# Patient Record
Sex: Female | Born: 1980 | Hispanic: No | Marital: Single | Smoking: Never smoker
Health system: Southern US, Community
[De-identification: ages and names within clinical notes are randomized; demographics above are authoritative.]

## PROBLEM LIST (undated history)

## (undated) DIAGNOSIS — E785 Hyperlipidemia, unspecified: Secondary | ICD-10-CM

## (undated) DIAGNOSIS — G809 Cerebral palsy, unspecified: Secondary | ICD-10-CM

## (undated) DIAGNOSIS — N809 Endometriosis, unspecified: Secondary | ICD-10-CM

## (undated) DIAGNOSIS — J302 Other seasonal allergic rhinitis: Secondary | ICD-10-CM

## (undated) DIAGNOSIS — I10 Essential (primary) hypertension: Secondary | ICD-10-CM

## (undated) DIAGNOSIS — M797 Fibromyalgia: Secondary | ICD-10-CM

## (undated) HISTORY — DX: Cerebral palsy, unspecified: G80.9

## (undated) HISTORY — DX: Essential (primary) hypertension: I10

## (undated) HISTORY — DX: Fibromyalgia: M79.7

## (undated) HISTORY — DX: Other seasonal allergic rhinitis: J30.2

## (undated) HISTORY — DX: Endometriosis, unspecified: N80.9

## (undated) HISTORY — DX: Hyperlipidemia, unspecified: E78.5

---

## 2005-04-02 ENCOUNTER — Ambulatory Visit (HOSPITAL_COMMUNITY): Admission: RE | Admit: 2005-04-02 | Discharge: 2005-04-02 | Payer: Self-pay | Admitting: Obstetrics & Gynecology

## 2005-10-14 ENCOUNTER — Encounter: Admission: RE | Admit: 2005-10-14 | Discharge: 2005-10-28 | Payer: Self-pay | Admitting: Obstetrics & Gynecology

## 2007-04-26 ENCOUNTER — Encounter: Admission: RE | Admit: 2007-04-26 | Discharge: 2007-05-27 | Payer: Self-pay | Admitting: Family Medicine

## 2012-06-24 ENCOUNTER — Ambulatory Visit (INDEPENDENT_AMBULATORY_CARE_PROVIDER_SITE_OTHER): Payer: Medicare Other | Admitting: Obstetrics & Gynecology

## 2012-06-24 ENCOUNTER — Encounter: Payer: Self-pay | Admitting: Obstetrics & Gynecology

## 2012-06-24 VITALS — BP 138/86 | HR 104 | Temp 97.4°F

## 2012-06-24 DIAGNOSIS — N949 Unspecified condition associated with female genital organs and menstrual cycle: Secondary | ICD-10-CM

## 2012-06-24 NOTE — Progress Notes (Signed)
Subjective:     Tiffany Pineda is a 32 y.o. female here for a routine exam.  Current complaints: none.  Personal health questionnaire reviewed: no.   The following portions of the patient's history were reviewed and updated as appropriate: allergies, current medications, past family history, past medical history, past social history, past surgical history and problem list.  Review of Systems Pertinent items are noted in HPI.    Objective:   No exam today  Assessment:    H/O chronic pelvic pain--symptoms remain controlled on continuous COCP  Plan:   Return in a few months

## 2012-06-25 ENCOUNTER — Encounter: Payer: Self-pay | Admitting: Obstetrics & Gynecology

## 2012-06-25 DIAGNOSIS — G8929 Other chronic pain: Secondary | ICD-10-CM | POA: Insufficient documentation

## 2012-07-02 ENCOUNTER — Telehealth: Payer: Self-pay | Admitting: *Deleted

## 2012-07-07 ENCOUNTER — Ambulatory Visit: Payer: Self-pay | Admitting: Obstetrics & Gynecology

## 2012-07-14 ENCOUNTER — Encounter: Payer: Self-pay | Admitting: *Deleted

## 2012-07-21 ENCOUNTER — Encounter: Payer: Self-pay | Admitting: Obstetrics & Gynecology

## 2012-07-21 ENCOUNTER — Encounter: Payer: Self-pay | Admitting: *Deleted

## 2012-07-23 NOTE — Telephone Encounter (Signed)
Entered in error

## 2012-08-09 ENCOUNTER — Encounter: Payer: Self-pay | Admitting: Obstetrics & Gynecology

## 2012-09-23 ENCOUNTER — Encounter: Payer: Self-pay | Admitting: Obstetrics & Gynecology

## 2012-09-23 ENCOUNTER — Ambulatory Visit (INDEPENDENT_AMBULATORY_CARE_PROVIDER_SITE_OTHER): Payer: Medicare Other | Admitting: Obstetrics & Gynecology

## 2012-09-23 VITALS — BP 133/76 | HR 94 | Temp 98.3°F

## 2012-09-23 DIAGNOSIS — N949 Unspecified condition associated with female genital organs and menstrual cycle: Secondary | ICD-10-CM

## 2012-09-23 NOTE — Progress Notes (Signed)
Subjective:     Tiffany Pineda is a 32 y.o. female here for a routine exam.  Current complaints: follow up. Pt states she is doing well on the Hines. Pt states she has no concerns at this time.  Personal health questionnaire reviewed: yes.   Gynecologic History No LMP recorded. Patient is not currently having periods (Reason: Oral contraceptives). Contraception: OCP (estrogen/progesterone) Last Pap: 2012. Results were: normal  Obstetric History OB History   Grav Para Term Preterm Abortions TAB SAB Ect Mult Living                   The following portions of the patient's history were reviewed and updated as appropriate: allergies, current medications, past family history, past medical history, past social history, past surgical history and problem list.  Review of Systems Pertinent items are noted in HPI.    Objective:  Exam today   Assessment:     History of endometriosis, symptoms controlled at present on the current see COCP  Plan:     Return in 6 months or when necessary

## 2013-03-24 ENCOUNTER — Ambulatory Visit (INDEPENDENT_AMBULATORY_CARE_PROVIDER_SITE_OTHER): Payer: Medicare Other | Admitting: Obstetrics & Gynecology

## 2013-03-24 ENCOUNTER — Encounter: Payer: Self-pay | Admitting: Obstetrics & Gynecology

## 2013-03-24 VITALS — BP 155/90 | HR 98 | Temp 97.0°F

## 2013-03-24 DIAGNOSIS — Z01419 Encounter for gynecological examination (general) (routine) without abnormal findings: Secondary | ICD-10-CM

## 2013-03-24 DIAGNOSIS — Z124 Encounter for screening for malignant neoplasm of cervix: Secondary | ICD-10-CM

## 2013-03-24 NOTE — Addendum Note (Signed)
Addended by: Henriette CombsHATTON, Jisela Merlino L on: 03/24/2013 11:41 AM   Modules accepted: Orders

## 2013-03-24 NOTE — Progress Notes (Signed)
Subjective:     Tiffany Pineda is a 33 y.o. female here for a routine exam.  Current complaints: Pt has no complaints today.  Personal health questionnaire reviewed: yes.   Gynecologic History No LMP recorded. Patient is not currently having periods (Reason: Oral contraceptives). Contraception: none Last Pap: 2012. Results were: normal Last mammogram: n/a  Obstetric History OB History  No data available     The following portions of the patient's history were reviewed and updated as appropriate: allergies, current medications, past family history, past medical history, past social history, past surgical history and problem list.  Review of Systems Pertinent items are noted in HPI.    Objective:    BP 155/90  Pulse 98  Temp(Src) 97 F (36.1 C)  General Appearance:    Alert, cooperative, no distress, appears stated age  Head:    Normocephalic, without obvious abnormality, atraumatic  Eyes:    PERRL, conjunctiva/corneas clear, EOM's intact, fundi    benign, both eyes  Ears:    Normal TM's and external ear canals, both ears  Nose:   Nares normal, septum midline, mucosa normal, no drainage    or sinus tenderness  Throat:   Lips, mucosa, and tongue normal; teeth and gums normal  Neck:   Supple, symmetrical, trachea midline, no adenopathy;    thyroid:  no enlargement/tenderness/nodules; no carotid   bruit or JVD  Back:     Symmetric, no curvature, ROM normal, no CVA tenderness  Lungs:     Clear to auscultation bilaterally, respirations unlabored  Chest Wall:    No tenderness or deformity   Heart:    Regular rate and rhythm, S1 and S2 normal, no murmur, rub   or gallop  Breast Exam:    No tenderness, masses, or nipple abnormality  Abdomen:     Soft, non-tender, bowel sounds active all four quadrants,    no masses, no organomegaly  Genitalia:    Normal female without lesion, discharge or tenderness  Rectal:    Normal tone, normal prostate, no masses or tenderness;   guaiac  negative stool  Extremities:   Extremities normal, atraumatic, no cyanosis or edema  Pulses:   2+ and symmetric all extremities  Skin:   Skin color, texture, turgor normal, no rashes or lesions  Lymph nodes:   Cervical, supraclavicular, and axillary nodes normal  Neurologic:   CNII-XII intact, normal strength, sensation and reflexes    throughout      Assessment:    Healthy female exam.    Plan:    Follow up in: 6 months.

## 2013-03-24 NOTE — Patient Instructions (Signed)

## 2013-03-25 LAB — PAP IG AND HPV HIGH-RISK: HPV DNA HIGH RISK: NOT DETECTED

## 2013-08-03 ENCOUNTER — Other Ambulatory Visit: Payer: Self-pay | Admitting: Obstetrics & Gynecology

## 2013-08-09 ENCOUNTER — Other Ambulatory Visit: Payer: Self-pay | Admitting: *Deleted

## 2013-08-09 DIAGNOSIS — IMO0001 Reserved for inherently not codable concepts without codable children: Secondary | ICD-10-CM

## 2013-08-09 MED ORDER — LEVONORGEST-ETH ESTRAD 91-DAY 0.15-0.03 &0.01 MG PO TABS: 1.0000 | ORAL_TABLET | Freq: Every day | ORAL | Status: DC

## 2013-09-12 ENCOUNTER — Telehealth: Payer: Self-pay | Admitting: *Deleted

## 2013-09-12 NOTE — Telephone Encounter (Signed)
Patient is using continuous pills and has started BTB since Friday am- no cycle in 5 years. Call after 4 today- may stop pills and have a cycle per Dr Tamela OddiJackson Moore

## 2013-09-13 NOTE — Telephone Encounter (Signed)
Patient states she doesn't want to bleed due to her endometriosis- patient is having heavy to light flow. Patient states she thinks her hemoglobin is low. Patient wants her bleeding to stop. Patient is having life changes at this time.Patient is having a hard time with transportation.

## 2013-09-15 ENCOUNTER — Emergency Department (HOSPITAL_BASED_OUTPATIENT_CLINIC_OR_DEPARTMENT_OTHER): Payer: Medicare Other

## 2013-09-15 ENCOUNTER — Emergency Department (HOSPITAL_BASED_OUTPATIENT_CLINIC_OR_DEPARTMENT_OTHER)
Admission: EM | Admit: 2013-09-15 | Discharge: 2013-09-15 | Disposition: A | Discharge status: Home / Self Care | Payer: Medicare Other | Attending: Emergency Medicine | Admitting: Emergency Medicine

## 2013-09-15 ENCOUNTER — Encounter (HOSPITAL_BASED_OUTPATIENT_CLINIC_OR_DEPARTMENT_OTHER): Payer: Self-pay | Admitting: Emergency Medicine

## 2013-09-15 DIAGNOSIS — Z791 Long term (current) use of non-steroidal anti-inflammatories (NSAID): Secondary | ICD-10-CM | POA: Insufficient documentation

## 2013-09-15 DIAGNOSIS — E785 Hyperlipidemia, unspecified: Secondary | ICD-10-CM | POA: Insufficient documentation

## 2013-09-15 DIAGNOSIS — R0789 Other chest pain: Secondary | ICD-10-CM | POA: Diagnosis not present

## 2013-09-15 DIAGNOSIS — Z3202 Encounter for pregnancy test, result negative: Secondary | ICD-10-CM | POA: Insufficient documentation

## 2013-09-15 DIAGNOSIS — Z8742 Personal history of other diseases of the female genital tract: Secondary | ICD-10-CM | POA: Diagnosis not present

## 2013-09-15 DIAGNOSIS — Z79899 Other long term (current) drug therapy: Secondary | ICD-10-CM | POA: Diagnosis not present

## 2013-09-15 DIAGNOSIS — Z8669 Personal history of other diseases of the nervous system and sense organs: Secondary | ICD-10-CM | POA: Diagnosis not present

## 2013-09-15 DIAGNOSIS — I10 Essential (primary) hypertension: Secondary | ICD-10-CM | POA: Diagnosis not present

## 2013-09-15 DIAGNOSIS — R1032 Left lower quadrant pain: Secondary | ICD-10-CM | POA: Diagnosis present

## 2013-09-15 DIAGNOSIS — R109 Unspecified abdominal pain: Secondary | ICD-10-CM

## 2013-09-15 DIAGNOSIS — K5900 Constipation, unspecified: Secondary | ICD-10-CM | POA: Insufficient documentation

## 2013-09-15 LAB — COMPREHENSIVE METABOLIC PANEL
ALT: 58 U/L — ABNORMAL HIGH (ref 0–35)
AST: 29 U/L (ref 0–37)
Albumin: 4.3 g/dL (ref 3.5–5.2)
Alkaline Phosphatase: 51 U/L (ref 39–117)
Anion gap: 15 (ref 5–15)
BUN: 10 mg/dL (ref 6–23)
CHLORIDE: 106 meq/L (ref 96–112)
CO2: 22 meq/L (ref 19–32)
CREATININE: 0.5 mg/dL (ref 0.50–1.10)
Calcium: 9.8 mg/dL (ref 8.4–10.5)
GFR calc non Af Amer: >90 mL/min (ref >90)
GLUCOSE: 96 mg/dL (ref 70–99)
Potassium: 4 mEq/L (ref 3.7–5.3)
SODIUM: 143 meq/L (ref 137–147)
TOTAL PROTEIN: 8 g/dL (ref 6.0–8.3)
Total Bilirubin: 0.3 mg/dL (ref 0.3–1.2)

## 2013-09-15 LAB — CBC WITH DIFFERENTIAL/PLATELET
BASOS ABS: 0 10*3/uL (ref 0.0–0.1)
BASOS PCT: 0 % (ref 0–1)
EOS ABS: 0 10*3/uL (ref 0.0–0.7)
EOS PCT: 0 % (ref 0–5)
HEMATOCRIT: 43.1 % (ref 36.0–46.0)
HEMOGLOBIN: 14.8 g/dL (ref 12.0–15.0)
LYMPHS ABS: 3 10*3/uL (ref 0.7–4.0)
LYMPHS PCT: 43 % (ref 12–46)
MCH: 29.9 pg (ref 26.0–34.0)
MCHC: 34.3 g/dL (ref 30.0–36.0)
MCV: 87.1 fL (ref 78.0–100.0)
MONO ABS: 0.3 10*3/uL (ref 0.1–1.0)
MONOS PCT: 5 % (ref 3–12)
Neutro Abs: 3.6 10*3/uL (ref 1.7–7.7)
Neutrophils Relative %: 52 % (ref 43–77)
Platelets: 321 10*3/uL (ref 150–400)
RBC: 4.95 MIL/uL (ref 3.87–5.11)
RDW: 13.6 % (ref 11.5–15.5)
WBC: 6.9 10*3/uL (ref 4.0–10.5)

## 2013-09-15 LAB — TROPONIN I: Troponin I: <0.3 ng/mL (ref <0.30)

## 2013-09-15 LAB — LIPASE, BLOOD: Lipase: 78 U/L — ABNORMAL HIGH (ref 11–59)

## 2013-09-15 LAB — URINALYSIS, ROUTINE W REFLEX MICROSCOPIC
BILIRUBIN URINE: NEGATIVE
GLUCOSE, UA: NEGATIVE mg/dL
Ketones, ur: 40 mg/dL — AB
Nitrite: NEGATIVE
PROTEIN: NEGATIVE mg/dL
SPECIFIC GRAVITY, URINE: 1.021 (ref 1.005–1.030)
UROBILINOGEN UA: 1 mg/dL (ref 0.0–1.0)
pH: 7 (ref 5.0–8.0)

## 2013-09-15 LAB — URINE MICROSCOPIC-ADD ON

## 2013-09-15 LAB — PREGNANCY, URINE: PREG TEST UR: NEGATIVE

## 2013-09-15 MED ORDER — KETOROLAC TROMETHAMINE 15 MG/ML IJ SOLN
15.0000 mg | Freq: Once | INTRAMUSCULAR | Status: AC
  Administered 2013-09-15: 15 mg via INTRAVENOUS
  Filled 2013-09-15: qty 1

## 2013-09-15 MED ORDER — SODIUM CHLORIDE 0.9 % IV BOLUS (SEPSIS)
1000.0000 mL | Freq: Once | INTRAVENOUS | Status: AC
  Administered 2013-09-15: 1000 mL via INTRAVENOUS

## 2013-09-15 MED ORDER — POLYETHYLENE GLYCOL 3350 17 G PO PACK: 17.0000 g | PACK | Freq: Every day | ORAL | Status: DC

## 2013-09-15 MED ORDER — IOHEXOL 300 MG/ML  SOLN
50.0000 mL | Freq: Once | INTRAMUSCULAR | Status: AC | PRN
  Administered 2013-09-15: 50 mL via ORAL

## 2013-09-15 MED ORDER — IOHEXOL 300 MG/ML  SOLN
100.0000 mL | Freq: Once | INTRAMUSCULAR | Status: AC | PRN
  Administered 2013-09-15: 100 mL via INTRAVENOUS

## 2013-09-15 NOTE — ED Notes (Signed)
Pt transported by [facility]. With history of endometriosis. Reports abdominal pain x several weeks with some nausea, sts pain isn't getting any better.

## 2013-09-15 NOTE — ED Notes (Signed)
Called into patients room, pt sts that she's having some "tightness" in her chest. MD Jodi MourningZavitz made aware.

## 2013-09-15 NOTE — ED Notes (Signed)
MD at bedside. 

## 2013-09-15 NOTE — ED Notes (Signed)
Pt reports pain is in LLQ. Sts she has been bleeding. Attempted to get into PMD today but was unable.

## 2013-09-15 NOTE — ED Notes (Signed)
Pt states that she wants to be transferred to another hospital for possible stress test.  Spoke to dr. Jodi MourningZavitz and states the pt can have it done as outpatient with her physician as he discussed.  Pt informed.

## 2013-09-15 NOTE — ED Notes (Signed)
MD at bedside talking with patient.

## 2013-09-15 NOTE — ED Provider Notes (Signed)
CSN: [id number]     Arrival date & time 09/15/13  1029 History   First MD Initiated Contact with Patient 09/15/13 1033     Chief Complaint  Patient presents with  . Abdominal Pain     (Consider location/radiation/quality/duration/timing/severity/associated sxs/prior Treatment) HPI Comments: 33 year old female with history of endometriosis, lipids, cerebral palsy, high blood pressure presents with abdominal pain and nausea. Patient has had endometriosis right significant time. Falls up with OB/GYN and recently had a thorough evaluation which was unremarkable. She presents with recurrent left lower quadrant abdominal pain with mild radiation the back and nausea, similar to multiple previous episodes with possibly more severity. Patient also has had over the past few weeks pain after eating all different types of foods, mild right upper quadrant and epigastric abdominal pain, no history of gallbladder problems. No fevers or chills. Decreased appetite patient feels dehydrated. No abdominal surgery history. Mild urinary symptoms.  Patient is a 33 y.o. female presenting with abdominal pain. The history is provided by the patient.  Abdominal Pain Associated symptoms: constipation, dysuria, nausea and vaginal bleeding (spotting, has improved recently)   Associated symptoms: no chest pain, no chills, no fever, no shortness of breath and no vomiting     Past Medical History  Diagnosis Date  . Endometriosis   . Hyperlipidemia   . Fibromyalgia   . Cerebral palsy   . Hypertension   . Seasonal allergies    History reviewed. No pertinent past surgical history. No family history on file. History  Substance Use Topics  . Smoking status: Never Smoker   . Smokeless tobacco: Never Used  . Alcohol Use: No   OB History   Grav Para Term Preterm Abortions TAB SAB Ect Mult Living                 Review of Systems  Constitutional: Positive for appetite change. Negative for fever and chills.  HENT:  Negative for congestion.   Eyes: Negative for visual disturbance.  Respiratory: Negative for shortness of breath.   Cardiovascular: Negative for chest pain.  Gastrointestinal: Positive for nausea, abdominal pain and constipation. Negative for vomiting and blood in stool.  Genitourinary: Positive for dysuria and vaginal bleeding (spotting, has improved recently). Negative for flank pain.  Musculoskeletal: Negative for back pain, neck pain and neck stiffness.  Skin: Negative for rash.  Neurological: Positive for light-headedness. Negative for headaches.      Allergies  Review of patient's allergies indicates no known allergies.  Home Medications   Prior to Admission medications   Medication Sig Start Date End Date Taking? Authorizing Provider  celecoxib (CELEBREX) 200 MG capsule Take 200 mg by mouth 2 (two) times daily.    Historical Provider, MD  chlorpheniramine (CHLOR-TRIMETON) 4 MG tablet Take 4 mg by mouth 2 (two) times daily as needed for allergies.    Historical Provider, MD  Levonorgestrel-Ethinyl Estradiol (SEASONIQUE) 0.15-0.03 &0.01 MG tablet Take 1 tablet by mouth daily. On a continuous basis 08/09/13   Antionette CharLisa Jackson-Moore, MD  lisinopril (PRINIVIL,ZESTRIL) 10 MG tablet Take 10 mg by mouth daily.    Historical Provider, MD  lovastatin (ALTOPREV) 40 MG 24 hr tablet Take 40 mg by mouth at bedtime.    Historical Provider, MD  norethindrone-ethinyl estradiol (JUNEL FE,GILDESS FE,LOESTRIN FE) 1-20 MG-MCG tablet Take 1 tablet by mouth daily.    Historical Provider, MD   BP 127/77  Pulse 88  Temp(Src) 98 F (36.7 C) (Oral)  Ht 4\' 9"  (1.448 m)  Wt 170 lb (  77.111 kg)  BMI 36.78 kg/m2  SpO2 99% Physical Exam  Nursing note and vitals reviewed. Constitutional: She is oriented to person, place, and time. She appears well-developed and well-nourished.  HENT:  Head: Normocephalic and atraumatic.  Dry mucous membranes  Eyes: Conjunctivae are normal. Right eye exhibits no discharge.  Left eye exhibits no discharge.  Neck: Normal range of motion. Neck supple. No tracheal deviation present.  Cardiovascular: Normal rate and regular rhythm.   Pulmonary/Chest: Effort normal and breath sounds normal.  Abdominal: Soft. She exhibits no distension. There is tenderness (mild left lower corner and and right upper quadrant). There is no guarding.  Musculoskeletal: She exhibits no edema.  Neurological: She is alert and oriented to person, place, and time.  Skin: Skin is warm. No rash noted.  Psychiatric: She has a normal mood and affect.    ED Course  Procedures (including critical care time) Labs Review Labs Reviewed  URINALYSIS, ROUTINE W REFLEX MICROSCOPIC - Abnormal; Notable for the following:    Hgb urine dipstick TRACE (*)    Ketones, ur 40 (*)    Leukocytes, UA TRACE (*)    All other components within normal limits  COMPREHENSIVE METABOLIC PANEL - Abnormal; Notable for the following:    ALT 58 (*)    All other components within normal limits  LIPASE, BLOOD - Abnormal; Notable for the following:    Lipase 78 (*)    All other components within normal limits  URINE MICROSCOPIC-ADD ON - Abnormal; Notable for the following:    Bacteria, UA FEW (*)    All other components within normal limits  PREGNANCY, URINE  CBC WITH DIFFERENTIAL  TROPONIN I    Imaging Review Ct Abdomen Pelvis W Contrast  09/15/2013   CLINICAL DATA:  Left lower quadrant abdominal pain.  Nausea.  EXAM: CT ABDOMEN AND PELVIS WITH CONTRAST  TECHNIQUE: Multidetector CT imaging of the abdomen and pelvis was performed using the standard protocol following bolus administration of intravenous contrast.  CONTRAST:  50mL OMNIPAQUE IOHEXOL 300 MG/ML SOLN, OMNIPAQUE IOHEXOL 300 MG/ML SOLN  COMPARISON:  None.  FINDINGS: There is mild linear atelectasis or scar at the right lung base. No pleural or pericardial fluid.  There is no significant finding within the liver. There is a 5 mm cyst in the right lobe.  There is a small region of focal fat next to the falciform ligament. The spleen is normal. The pancreas is normal. The adrenal glands are normal. The kidneys are normal. The aorta is small but not apparently affected by overlying pathology. The IVC is normal. No retroperitoneal mass or adenopathy. No free intraperitoneal fluid or air. There is a large amount of fecal matter in the rectosigmoid. No evidence of diverticulosis or diverticulitis. The appendix is normal. No acute osseous finding. 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 There is a smaller osteochondroma of the left femur.  IMPRESSION: No acute finding. No specific cause of left lower quadrant pain identified. The patient does have a fairly large amount of stool in the rectosigmoid. No evidence of underlying primary bowel pathology.  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   On: 09/15/2013 12:39     EKG Interpretation   Date/Time:  Thursday September 15 2013 12:54:40 EDT Ventricular Rate:  84 PR Interval:  124 QRS Duration: 70 QT  Interval:  364 QTC Calculation: 430 R Axis:   25 Text Interpretation:  Normal sinus rhythm Nonspecific T wave abnormality  Abnormal ECG Confirmed by Jed Kutch  MD, Jaidon Sponsel ([id number]) on 09/15/2013 12:56:48  PM      MDM   Final diagnoses:  Feeling of chest tightness  Abdominal pain, unspecified abdominal location  Constipation, unspecified constipation type   Patient with 2 different regions of abdominal pain. Overall left lower quadrant bowel pain similar to previous endometriosis and patient has had recent evaluation has followup with OB/GYN. CT scan ordered to evaluate her other cause of left lower corner abdominal pain such as diverticulitis and in addition to look at the gallbladder for right upper quadrant abdominal pain. Blood work for abdomen  and urinalysis pending. IV fluids for clinical dehydration. Patient does not want antinausea meds at this time.  Patient's symptoms improved on reassessment. Patient had very brief episode of chest tightness lasting a few seconds in ER. No cardiac history known. Patient does have high blood pressure high lipids  no exertional symptoms and she's had recurrent atypical chest tightness for "a long time". EKG nonspecific findings no acute ischemic change, no old EKG, patient low risk in note chest tightness on discharge. Discussed close followup with primary Dr. and outpatient stress test.  CT scan unremarkable except for stool in the colon. Results and differential diagnosis were discussed with the patient/parent/guardian. Close follow up outpatient was discussed, comfortable with the plan.   Medications  sodium chloride 0.9 % bolus 1,000 mL (1,000 mLs Intravenous New Bag/Given 09/15/13 1236)  ketorolac (TORADOL) 15 MG/ML injection 15 mg (15 mg Intravenous Given 09/15/13 1236)  iohexol (OMNIPAQUE) 300 MG/ML solution 50 mL (50 mLs Oral Contrast Given 09/15/13 1107)  iohexol (OMNIPAQUE) 300 MG/ML solution 100 mL (100 mLs Intravenous Contrast Given 09/15/13 1209)    Filed Vitals:   09/15/13 1030  BP: 127/77  Pulse: 88  Temp: 98 F (36.7 C)  TempSrc: Oral  Height: 4\' 9"  (1.448 m)  Weight: 170 lb (77.111 kg)  SpO2: 99%         Enid Skeens, MD 09/15/13 1341

## 2013-09-15 NOTE — ED Notes (Signed)
Pts mother came out of room stating that the patient "is under a lot of stress." Requesting to have social services involved since pt now lives alone since her significant other left.

## 2013-09-15 NOTE — Discharge Instructions (Signed)
See your local Dr. for reassessment and discuss intermittent chest tightness and recurrent abdominal pain. Tried MiraLAX for constipation until stools are normal/soft.  If you were given medicines take as directed.  If you are on coumadin or contraceptives realize their levels and effectiveness is altered by many different medicines.  If you have any reaction (rash, tongues swelling, other) to the medicines stop taking and see a physician.   Please follow up as directed and return to the ER or see a physician for new or worsening symptoms.  Thank you. Filed Vitals:   09/15/13 1030  BP: 127/77  Pulse: 88  Temp: 98 F (36.7 C)  TempSrc: Oral  Height: 4\' 9"  (1.448 m)  Weight: 170 lb (77.111 kg)  SpO2: 99%

## 2013-09-15 NOTE — ED Notes (Signed)
[facility] has been called to transport home

## 2013-09-19 NOTE — Telephone Encounter (Signed)
Patient was called per Sue LushAndrea last week- she will address her concerns with Dr Tamela OddiJackson Moore at her up coming appointment.

## 2013-09-22 ENCOUNTER — Ambulatory Visit (INDEPENDENT_AMBULATORY_CARE_PROVIDER_SITE_OTHER): Payer: Medicare Other | Admitting: Obstetrics & Gynecology

## 2013-09-22 ENCOUNTER — Encounter: Payer: Self-pay | Admitting: Obstetrics & Gynecology

## 2013-09-22 VITALS — BP 131/88 | HR 93 | Temp 96.9°F

## 2013-09-22 DIAGNOSIS — N926 Irregular menstruation, unspecified: Secondary | ICD-10-CM

## 2013-09-22 DIAGNOSIS — N949 Unspecified condition associated with female genital organs and menstrual cycle: Secondary | ICD-10-CM

## 2013-09-22 DIAGNOSIS — N939 Abnormal uterine and vaginal bleeding, unspecified: Secondary | ICD-10-CM

## 2013-09-22 NOTE — Progress Notes (Signed)
Patient ID: Tiffany Pineda, female   DOB: [birth year], 33 y.o.   MRN: [id number]  Chief Complaint  Patient presents with  . Unscheduled bleeding, pelvic pain    HPI Tiffany Pineda is a 33 y.o. female.  She is experiencing unscheduled bleeding, pain on current extended COCP.  HPI  Past Medical History  Diagnosis Date  . Endometriosis   . Hyperlipidemia   . Fibromyalgia   . Cerebral palsy   . Hypertension   . Seasonal allergies     No past surgical history on file.  No family history on file.  Social History History  Substance Use Topics  . Smoking status: Never Smoker   . Smokeless tobacco: Never Used  . Alcohol Use: No    No Known Allergies  Current Outpatient Prescriptions  Medication Sig Dispense Refill  . celecoxib (CELEBREX) 200 MG capsule Take 200 mg by mouth 2 (two) times daily.      . chlorpheniramine (CHLOR-TRIMETON) 4 MG tablet Take 4 mg by mouth 2 (two) times daily as needed for allergies.      . Levonorgestrel-Ethinyl Estradiol (SEASONIQUE) 0.15-0.03 &0.01 MG tablet Take 1 tablet by mouth daily. On a continuous basis  1 Package  11  . lisinopril (PRINIVIL,ZESTRIL) 10 MG tablet Take 10 mg by mouth daily.      Marland Kitchen. lovastatin (ALTOPREV) 40 MG 24 hr tablet Take 40 mg by mouth at bedtime.      . polyethylene glycol (MIRALAX / GLYCOLAX) packet Take 17 g by mouth daily.  7 each  0  . norethindrone-ethinyl estradiol (JUNEL FE,GILDESS FE,LOESTRIN FE) 1-20 MG-MCG tablet Take 1 tablet by mouth daily.       No current facility-administered medications for this visit.    Review of Systems Review of Systems Constitutional: negative for fatigue and weight loss Respiratory: negative for cough and wheezing Cardiovascular: negative for chest pain, fatigue and palpitations Gastrointestinal: negative for abdominal pain; positive for constipation Genitourinary: positive for pelvic pain, abnormal bleeding Integument/breast: negative for nipple discharge Musculoskeletal:negative  for myalgias Neurological: negative for gait problems and tremors Behavioral/Psych: positive for depression Endocrine: negative for temperature intolerance     Blood pressure 131/88, pulse 93, temperature 96.9 F (36.1 C), last menstrual period 09/09/2013.  Physical Exam Physical Exam   50% of 15 min visit spent on counseling and coordination of care.   Data Reviewed None  Assessment    Chronic pelvic pain, unscheduled bleeding with extended COCP Pain exacerbated by depression/stress ?IBS    Plan   Trial of Quartette  Need to obtain previous records Possible management options include: Depo provera, Mirena IUD, aromatase inhibitor, SSRI, neuromodulator, laparoscopy Follow up as needed or in 3 mths         JACKSON-MOORE,Eliyahu Bille A 09/22/2013, 11:55 AM

## 2013-09-22 NOTE — Patient Instructions (Signed)
Levonorgestrel intrauterine device (IUD) What is this medicine? LEVONORGESTREL IUD (LEE voe nor jes trel) is a contraceptive (birth control) device. The device is placed inside the uterus by a healthcare professional. It is used to prevent pregnancy and can also be used to treat heavy bleeding that occurs during your period. Depending on the device, it can be used for 3 to 5 years. This medicine may be used for other purposes; ask your health care provider or pharmacist if you have questions. COMMON BRAND NAME(S): LILETTA, Mirena, Skyla What should I tell my health care provider before I take this medicine? They need to know if you have any of these conditions: -abnormal Pap smear -cancer of the breast, uterus, or cervix -diabetes -endometritis -genital or pelvic infection now or in the past -have more than one sexual partner or your partner has more than one partner -heart disease -history of an ectopic or tubal pregnancy -immune system problems -IUD in place -liver disease or tumor -problems with blood clots or take blood-thinners -use intravenous drugs -uterus of unusual shape -vaginal bleeding that has not been explained -an unusual or allergic reaction to levonorgestrel, other hormones, silicone, or polyethylene, medicines, foods, dyes, or preservatives -pregnant or trying to get pregnant -breast-feeding How should I use this medicine? This device is placed inside the uterus by a health care professional. Talk to your pediatrician regarding the use of this medicine in children. Special care may be needed. Overdosage: If you think you have taken too much of this medicine contact a poison control center or emergency room at once. NOTE: This medicine is only for you. Do not share this medicine with others. What if I miss a dose? This does not apply. What may interact with this medicine? Do not take this medicine with any of the following  medications: -amprenavir -bosentan -fosamprenavir This medicine may also interact with the following medications: -aprepitant -barbiturate medicines for inducing sleep or treating seizures -bexarotene -griseofulvin -medicines to treat seizures like carbamazepine, ethotoin, felbamate, oxcarbazepine, phenytoin, topiramate -modafinil -pioglitazone -rifabutin -rifampin -rifapentine -some medicines to treat HIV infection like atazanavir, indinavir, lopinavir, nelfinavir, tipranavir, ritonavir -St. John's wort -warfarin This list may not describe all possible interactions. Give your health care provider a list of all the medicines, herbs, non-prescription drugs, or dietary supplements you use. Also tell them if you smoke, drink alcohol, or use illegal drugs. Some items may interact with your medicine. What should I watch for while using this medicine? Visit your doctor or health care professional for regular check ups. See your doctor if you or your partner has sexual contact with others, becomes HIV positive, or gets a sexual transmitted disease. This product does not protect you against HIV infection (AIDS) or other sexually transmitted diseases. You can check the placement of the IUD yourself by reaching up to the top of your vagina with clean fingers to feel the threads. Do not pull on the threads. It is a good habit to check placement after each menstrual period. Call your doctor right away if you feel more of the IUD than just the threads or if you cannot feel the threads at all. The IUD may come out by itself. You may become pregnant if the device comes out. If you notice that the IUD has come out use a backup birth control method like condoms and call your health care provider. Using tampons will not change the position of the IUD and are okay to use during your period. What side effects may   I notice from receiving this medicine? Side effects that you should report to your doctor or  health care professional as soon as possible: -allergic reactions like skin rash, itching or hives, swelling of the face, lips, or tongue -fever, flu-like symptoms -genital sores -high blood pressure -no menstrual period for 6 weeks during use -pain, swelling, warmth in the leg -pelvic pain or tenderness -severe or sudden headache -signs of pregnancy -stomach cramping -sudden shortness of breath -trouble with balance, talking, or walking -unusual vaginal bleeding, discharge -yellowing of the eyes or skin Side effects that usually do not require medical attention (report to your doctor or health care professional if they continue or are bothersome): -acne -breast pain -change in sex drive or performance -changes in weight -cramping, dizziness, or faintness while the device is being inserted -headache -irregular menstrual bleeding within first 3 to 6 months of use -nausea This list may not describe all possible side effects. Call your doctor for medical advice about side effects. You may report side effects to FDA at 1-800-FDA-1088. Where should I keep my medicine? This does not apply. NOTE: This sheet is a summary. It may not cover all possible information. If you have questions about this medicine, talk to your doctor, pharmacist, or health care provider.  2015, Elsevier/Gold Standard. (2011-03-20 13:54:04)  

## 2013-12-07 ENCOUNTER — Telehealth: Payer: Self-pay | Admitting: *Deleted

## 2013-12-07 NOTE — Telephone Encounter (Signed)
Patient states she does not see a big difference in the pills- patient wants to change back to her Seasonique.

## 2013-12-12 ENCOUNTER — Other Ambulatory Visit: Payer: Self-pay | Admitting: *Deleted

## 2013-12-13 NOTE — Telephone Encounter (Signed)
Called patient's pharmacy and changed Rx back to previous.

## 2013-12-14 ENCOUNTER — Telehealth: Payer: Self-pay | Admitting: *Deleted

## 2013-12-14 NOTE — Telephone Encounter (Signed)
Patient had called on Friday stating that she needed the brand name Seasonique. Patient states that pharmacy shipped out the generic and she was unable to stop the shipment because we did not specify that it needed to be the brand name. Patient states she will take the generic for now but would like us to call and change it to the brand name for her next shipment.   I contacted the pharmacy at the number the patient provided and had it changed to the brand name with the 3 refills. Pharmacy stated they had changed it and would ship out seasonique with the next shipment.   Patient notified that Rx had been taken care of. Patient asked if Solmon IceSeasonique was the brand name, Patient notified that yes Solmon IceSeasonique was the brand name. Patient states that she has still not received her shipment. Patient notified that they did not mention anything about it over the phone that she would have to call the pharmacy to find out about her shipment. Patient voiced understanding.

## 2013-12-22 ENCOUNTER — Ambulatory Visit (INDEPENDENT_AMBULATORY_CARE_PROVIDER_SITE_OTHER): Payer: Medicare Other | Admitting: Obstetrics & Gynecology

## 2013-12-22 ENCOUNTER — Encounter: Payer: Self-pay | Admitting: Obstetrics & Gynecology

## 2013-12-22 VITALS — BP 129/88 | HR 89 | Temp 97.9°F

## 2013-12-22 DIAGNOSIS — G8929 Other chronic pain: Secondary | ICD-10-CM

## 2013-12-22 DIAGNOSIS — N949 Unspecified condition associated with female genital organs and menstrual cycle: Secondary | ICD-10-CM

## 2013-12-22 DIAGNOSIS — R102 Pelvic and perineal pain: Principal | ICD-10-CM

## 2013-12-22 NOTE — Progress Notes (Signed)
Patient ID: Tiffany Pineda, female   DOB: Mar 07, 1980, 33 y.o.   MRN: [id number]  Chief Complaint  Patient presents with  . Unscheduled bleeding, pelvic pain    HPI Tiffany Pineda is a 33 y.o. female.  She is experiencing worse unscheduled bleeding, pain on current extended COCP.  HPI  Past Medical History  Diagnosis Date  . Endometriosis   . Hyperlipidemia   . Fibromyalgia   . Cerebral palsy   . Hypertension   . Seasonal allergies     No past surgical history on file.  No family history on file.  Social History History  Substance Use Topics  . Smoking status: Never Smoker   . Smokeless tobacco: Never Used  . Alcohol Use: No    No Known Allergies  Current Outpatient Prescriptions  Medication Sig Dispense Refill  . celecoxib (CELEBREX) 200 MG capsule Take 200 mg by mouth 2 (two) times daily.      . chlorpheniramine (CHLOR-TRIMETON) 4 MG tablet Take 4 mg by mouth 2 (two) times daily as needed for allergies.      . Levonorgestrel-Ethinyl Estradiol (SEASONIQUE) 0.15-0.03 &0.01 MG tablet Take 1 tablet by mouth daily. On a continuous basis  1 Package  11  . lisinopril (PRINIVIL,ZESTRIL) 10 MG tablet Take 10 mg by mouth daily.      Marland Kitchen. lovastatin (ALTOPREV) 40 MG 24 hr tablet Take 40 mg by mouth at bedtime.      . norethindrone-ethinyl estradiol (JUNEL FE,GILDESS FE,LOESTRIN FE) 1-20 MG-MCG tablet Take 1 tablet by mouth daily.      . polyethylene glycol (MIRALAX / GLYCOLAX) packet Take 17 g by mouth daily.  7 each  0   No current facility-administered medications for this visit.    Review of Systems Review of Systems Constitutional: negative for fatigue and weight loss Respiratory: negative for cough and wheezing Cardiovascular: negative for chest pain, fatigue and palpitations Gastrointestinal: negative for abdominal pain; positive for constipation Genitourinary: positive for pelvic pain, abnormal bleeding Integument/breast: negative for nipple  discharge Musculoskeletal:negative for myalgias Neurological: negative for gait problems and tremors Behavioral/Psych: positive for depression Endocrine: negative for temperature intolerance     Blood pressure 129/88, pulse 89, temperature 97.9 F (36.6 C).  Physical Exam Physical Exam   50% of 15 min visit spent on counseling and coordination of care.   Data Reviewed None  Assessment    Chronic pelvic pain, worsened unscheduled bleeding with current extended COCP     Plan She requests to resume seasonique Follow up as needed or in 3 mths--may need to change to progestin-only medication in the setting of hyperlipidemia         JACKSON-MOORE,Beren Yniguez A 12/22/2013, 11:51 AM

## 2014-01-03 ENCOUNTER — Telehealth: Payer: Self-pay | Admitting: *Deleted

## 2014-01-03 NOTE — Telephone Encounter (Signed)
Call to patient - let her know about the providers concerns regarding her cardiovascular risk/OCP. Patient is aware, but wants to continue at this time. Patient does stae that she feels she has more cramping and discomfort with the generic OCP. Patient is also concerned about bleeding with the POP. Discussed other options- ablation, ect. Patient to do some research on that.

## 2014-01-03 NOTE — Telephone Encounter (Signed)
-----   Message from Antionette CharLisa Jackson-Moore, MD sent at 12/23/2013  9:58 PM EDT ----- Call pt--concerned about COCP with her hyperlipidemia/age--increased cardiovascular risk.  Consider progestin-only treatment

## 2014-02-27 ENCOUNTER — Encounter: Payer: Self-pay | Admitting: *Deleted

## 2014-02-28 ENCOUNTER — Encounter: Payer: Self-pay | Admitting: Obstetrics & Gynecology

## 2014-03-08 ENCOUNTER — Telehealth: Payer: Self-pay | Admitting: *Deleted

## 2014-03-08 NOTE — Telephone Encounter (Signed)
Placed call to pt regarding PA on medication.  Left message on voicemail making pt aware that [facility] and are working with them to get her medication approved.  Made pt aware that it may take 48-72 hours for response from insurance.  Advised pt that she would be contacted once approval has been received in office from insurance.  Pt advised to contact office with any other needs.

## 2014-03-13 ENCOUNTER — Encounter: Payer: Self-pay | Admitting: *Deleted

## 2014-03-21 ENCOUNTER — Telehealth: Payer: Self-pay | Admitting: *Deleted

## 2014-03-21 NOTE — Telephone Encounter (Signed)
Call placed to pt making her aware that her medication, Seasonique, has been approved for coverage.  Pt made aware that approval report will be sent to office. Medication was approved for dates 03/19/2014-03/03/2015.   Pt advised to contact office if she has any further problems with this medication. Pt states understanding.

## 2014-06-28 ENCOUNTER — Ambulatory Visit (INDEPENDENT_AMBULATORY_CARE_PROVIDER_SITE_OTHER): Payer: Medicare Other | Admitting: Obstetrics

## 2014-06-28 ENCOUNTER — Encounter: Payer: Self-pay | Admitting: Obstetrics

## 2014-06-28 ENCOUNTER — Ambulatory Visit: Payer: Medicare Other | Admitting: Obstetrics & Gynecology

## 2014-06-28 VITALS — BP 152/93 | HR 104 | Temp 97.7°F

## 2014-06-28 DIAGNOSIS — N939 Abnormal uterine and vaginal bleeding, unspecified: Secondary | ICD-10-CM | POA: Diagnosis not present

## 2014-06-28 DIAGNOSIS — N809 Endometriosis, unspecified: Secondary | ICD-10-CM

## 2014-06-28 DIAGNOSIS — N949 Unspecified condition associated with female genital organs and menstrual cycle: Secondary | ICD-10-CM | POA: Diagnosis not present

## 2014-06-28 DIAGNOSIS — G8929 Other chronic pain: Secondary | ICD-10-CM | POA: Diagnosis not present

## 2014-06-28 DIAGNOSIS — R102 Pelvic and perineal pain: Secondary | ICD-10-CM

## 2014-06-28 NOTE — Progress Notes (Signed)
Patient reports she is doing well with her pills with no bleeding. Patient's last pap was 2015 and normal- pap/HPV. Patient ID: Tiffany Pineda, female   DOB: 10-Dec-1980, 34 y.o.   MRN: [id number]  Chief Complaint  Patient presents with  . Gynecologic Exam    follow up to bleeding    HPI Tiffany Ripplebigail Roan is a 34 y.o. female.  H/O endometriosis with chronic pelvic pain and AUB.  Presents for F/U.  No complaints. HPI  Past Medical History  Diagnosis Date  . Endometriosis   . Hyperlipidemia   . Fibromyalgia   . Cerebral palsy   . Hypertension   . Seasonal allergies     History reviewed. No pertinent past surgical history.  History reviewed. No pertinent family history.  Social History History  Substance Use Topics  . Smoking status: Never Smoker   . Smokeless tobacco: Never Used  . Alcohol Use: No    No Known Allergies  Current Outpatient Prescriptions  Medication Sig Dispense Refill  . celecoxib (CELEBREX) 200 MG capsule Take 200 mg by mouth 2 (two) times daily.    . fluticasone (FLONASE) 50 MCG/ACT nasal spray Place into both nostrils daily.    . Levonorgestrel-Ethinyl Estradiol (SEASONIQUE) 0.15-0.03 &0.01 MG tablet Take 1 tablet by mouth daily. On a continuous basis 1 Package 11  . lisinopril (PRINIVIL,ZESTRIL) 10 MG tablet Take 10 mg by mouth daily.    Marland Kitchen. lovastatin (ALTOPREV) 40 MG 24 hr tablet Take 40 mg by mouth at bedtime.    . polyethylene glycol (MIRALAX / GLYCOLAX) packet Take 17 g by mouth daily. 7 each 0  . chlorpheniramine (CHLOR-TRIMETON) 4 MG tablet Take 4 mg by mouth 2 (two) times daily as needed for allergies.    Marland Kitchen. norethindrone-ethinyl estradiol (JUNEL FE,GILDESS FE,LOESTRIN FE) 1-20 MG-MCG tablet Take 1 tablet by mouth daily.     No current facility-administered medications for this visit.    Review of Systems Review of Systems Constitutional: negative for fatigue and weight loss Respiratory: negative for cough and wheezing Cardiovascular: negative for  chest pain, fatigue and palpitations Gastrointestinal: negative for abdominal pain and change in bowel habits Genitourinary: endometriosis Integument/breast: negative for nipple discharge Musculoskeletal:negative for myalgias Neurological: negative for gait problems and tremors Behavioral/Psych: negative for abusive relationship, depression Endocrine: negative for temperature intolerance     Blood pressure 152/93, pulse 104, temperature 97.7 F (36.5 C).  Physical Exam Physical Exam:  Deferred  100% of 15 min visit spent on counseling and coordination of care.   Data Reviewed History  Assessment     Endometriosis Chronic pelvic pain AUB Doing well on Seasonique     Plan    Continue Seasonique F/U for Annual Exam   No orders of the defined types were placed in this encounter.   Meds ordered this encounter  Medications  . fluticasone (FLONASE) 50 MCG/ACT nasal spray    Sig: Place into both nostrils daily.

## 2014-07-18 ENCOUNTER — Telehealth: Payer: Self-pay | Admitting: *Deleted

## 2014-07-18 NOTE — Telephone Encounter (Signed)
Patient states she is having urinary symptoms. Patient states she is having frequent urination. Patient states she is having some pain and spotting. Patient states she is also having some nausea.  Per nursing protocol prescription called to the pharmacy.   *Called prescription to [facility] delivery. They will deliver prescription to the patient's home. Patient advised and verbalized understanding.

## 2014-07-24 ENCOUNTER — Telehealth: Payer: Self-pay | Admitting: *Deleted

## 2014-07-24 NOTE — Telephone Encounter (Signed)
Called pt to verify that her symptoms were better since starting abx.  Pt states that her symptoms are improving but she is still having occasional spotting.  Pt states that in the past she had been allowed by Dr Tamela OddiJackson Moore to "[facility]" [facility].  Pt would like to know if this is advisable at this time.  Pt was reviewed with Dr Clearance CootsHarper.  He recommends that if spotting is light and occasional then the pt should be ok to watch symptoms.  If spotting becomes more regular and continues then pt may double birth control as in the past.  Pt made aware of recommendations and advised to monitor spotting.  Pt advised to make office aware if spotting becomes a problem and/or if she would like an appt. Pt also states that she will need her refill of OCP sent to [facility] [phone]) for the remainder of the year.  Pt advised that her refill will be sent to pharmacy along with approval information.  Pt advised to contact office with any further concerns.

## 2014-12-20 ENCOUNTER — Ambulatory Visit: Payer: Medicare Other | Admitting: Certified Nurse Midwife

## 2014-12-27 ENCOUNTER — Ambulatory Visit (INDEPENDENT_AMBULATORY_CARE_PROVIDER_SITE_OTHER): Payer: Medicare Other | Admitting: Certified Nurse Midwife

## 2014-12-27 ENCOUNTER — Encounter: Payer: Self-pay | Admitting: Certified Nurse Midwife

## 2014-12-27 VITALS — BP 140/93 | HR 96 | Temp 98.1°F

## 2014-12-27 DIAGNOSIS — Z01419 Encounter for gynecological examination (general) (routine) without abnormal findings: Secondary | ICD-10-CM

## 2014-12-27 DIAGNOSIS — Z3041 Encounter for surveillance of contraceptive pills: Secondary | ICD-10-CM

## 2014-12-27 DIAGNOSIS — Z0000 Encounter for general adult medical examination without abnormal findings: Secondary | ICD-10-CM

## 2014-12-27 DIAGNOSIS — N760 Acute vaginitis: Secondary | ICD-10-CM

## 2014-12-27 MED ORDER — FLUCONAZOLE 100 MG PO TABS: 100.0000 mg | ORAL_TABLET | Freq: Once | ORAL | Status: AC

## 2014-12-27 MED ORDER — LEVONORGEST-ETH ESTRAD 91-DAY 0.15-0.03 &0.01 MG PO TABS: 1.0000 | ORAL_TABLET | Freq: Every day | ORAL | Status: AC

## 2014-12-27 NOTE — Progress Notes (Signed)
Patient ID: Tiffany Pineda, female   DOB: 1980/12/25, 34 y.o.   MRN: [id number]    Subjective:        Tiffany Pineda is a 34 y.o. female here for a routine exam.  Current complaints: none.  Desires to continue on [place].  Doing well.  Not currently having break through bleeding. Not sexually active.      Personal health questionnaire:  Is patient Ashkenazi Jewish, have a family history of breast and/or ovarian cancer: no Is there a family history of uterine cancer diagnosed at age < 22, gastrointestinal cancer, urinary tract cancer, family member who is a Personnel officer syndrome-associated carrier: no Is the patient overweight and hypertensive, family history of diabetes, personal history of gestational diabetes, preeclampsia or PCOS: yes Is patient over 36, have PCOS,  family history of premature CHD under age 63, diabetes, smoke, have hypertension or peripheral artery disease:  no At any time, has a partner hit, kicked or otherwise hurt or frightened you?: no Over the past 2 weeks, have you felt down, depressed or hopeless?: no Over the past 2 weeks, have you felt little interest or pleasure in doing things?:no   Gynecologic History No LMP recorded. Patient is not currently having periods (Reason: Oral contraceptives). Contraception: OCP (estrogen/progesterone) Last Pap: 03/24/13. Results were: normal Last mammogram: N/A.   Obstetric History OB History  No data available    Past Medical History  Diagnosis Date  . Endometriosis   . Hyperlipidemia   . Fibromyalgia   . Cerebral palsy (HCC)   . Hypertension   . Seasonal allergies     History reviewed. No pertinent past surgical history.   Current outpatient prescriptions:  .  celecoxib (CELEBREX) 200 MG capsule, Take 200 mg by mouth 2 (two) times daily., Disp: , Rfl:  .  fluticasone (FLONASE) 50 MCG/ACT nasal spray, Place into both nostrils daily., Disp: , Rfl:  .  Levonorgestrel-Ethinyl Estradiol (SEASONIQUE) 0.15-0.03 &0.01 MG  tablet, Take 1 tablet by mouth daily. On a continuous basis, Disp: 1 Package, Rfl: 11 .  lisinopril (PRINIVIL,ZESTRIL) 10 MG tablet, Take 10 mg by mouth daily., Disp: , Rfl:  .  lovastatin (ALTOPREV) 40 MG 24 hr tablet, Take 40 mg by mouth at bedtime., Disp: , Rfl:  .  chlorpheniramine (CHLOR-TRIMETON) 4 MG tablet, Take 4 mg by mouth 2 (two) times daily as needed for allergies., Disp: , Rfl:  .  fluconazole (DIFLUCAN) 100 MG tablet, Take 1 tablet (100 mg total) by mouth once. Repeat dose in 48-72 hour., Disp: 3 tablet, Rfl: 0 .  norethindrone-ethinyl estradiol (JUNEL FE,GILDESS FE,LOESTRIN FE) 1-20 MG-MCG tablet, Take 1 tablet by mouth daily., Disp: , Rfl:  No Known Allergies  Social History  Substance Use Topics  . Smoking status: Never Smoker   . Smokeless tobacco: Never Used  . Alcohol Use: No    History reviewed. No pertinent family history.    Review of Systems  Constitutional: negative for fatigue and weight loss Respiratory: negative for cough and wheezing Cardiovascular: negative for chest pain, fatigue and palpitations Gastrointestinal: negative for abdominal pain and change in bowel habits Musculoskeletal:negative for myalgias Neurological: negative for gait problems and tremors Behavioral/Psych: negative for abusive relationship, depression Endocrine: negative for temperature intolerance   Genitourinary:negative for abnormal menstrual periods, genital lesions, hot flashes, sexual problems and vaginal discharge Integument/breast: negative for breast lump, breast tenderness, nipple discharge and skin lesion(s)    Objective:       BP 140/93 mmHg  Pulse 96  Temp(Src)  98.1 F (36.7 C) General:   alert  Skin:   no rash or abnormalities  Lungs:   clear to auscultation bilaterally  Heart:   regular rate and rhythm, S1, S2 normal, no murmur, click, rub or gallop  Breasts:   normal without suspicious masses, skin or nipple changes or axillary nodes  Abdomen:  normal  findings: no organomegaly, soft, non-tender and no hernia  Pelvis:  External genitalia: normal general appearance Urinary system: urethral meatus normal and bladder without fullness, nontender Vaginal: normal without tenderness, induration or masses Cervix: normal appearance Adnexa: normal bimanual exam Uterus: anteverted and non-tender, normal size   Lab Review Urine pregnancy test Labs reviewed yes Radiologic studies reviewed no  50% of 30 min visit spent on counseling and coordination of care.   Assessment:    Healthy female exam.   H/O endometriosis  CP  Contraception management  Vaginal candidiasis  Plan:    Education reviewed: calcium supplements, depression evaluation, low fat, low cholesterol diet, self breast exams and skin cancer screening. Contraception: OCP (estrogen/progesterone). Follow up in: 1 year.   Meds ordered this encounter  Medications  . fluconazole (DIFLUCAN) 100 MG tablet    Sig: Take 1 tablet (100 mg total) by mouth once. Repeat dose in 48-72 hour.    Dispense:  3 tablet    Refill:  0  . Levonorgestrel-Ethinyl Estradiol (SEASONIQUE) 0.15-0.03 &0.01 MG tablet    Sig: Take 1 tablet by mouth daily. On a continuous basis    Dispense:  1 Package    Refill:  11    Pt is taking birth control on a continuous basis.  Brand name only, no generic.   Orders Placed This Encounter  Procedures  . Bacterial Vaginosis DNA  . Candidiasis, PCR    Possible management options include: surgical tx: endometrial ablation, hysterectomy

## 2015-01-01 LAB — BACTERIAL VAGINOSIS DNA
ATOPOBIUM VAGINAE: DETECTED Log (cells/mL)
GARDNERELLA VAGINALIS: 6.1 Log (cells/mL)
LACTOBACILLUS SPECIES: NOT DETECTED Log (cells/mL)
MEGASPHAERA SPECIES: NOT DETECTED Log (cells/mL)

## 2015-01-01 LAB — CANDIDIASIS, PCR
C. PARAPSILOSIS, DNA: NOT DETECTED
C. albicans, DNA: NOT DETECTED
C. glabrata, DNA: NOT DETECTED
C. tropicalis, DNA: NOT DETECTED

## 2015-01-02 ENCOUNTER — Other Ambulatory Visit: Payer: Self-pay | Admitting: Certified Nurse Midwife

## 2015-01-02 DIAGNOSIS — B9689 Other specified bacterial agents as the cause of diseases classified elsewhere: Secondary | ICD-10-CM

## 2015-01-02 DIAGNOSIS — N760 Acute vaginitis: Principal | ICD-10-CM

## 2015-01-02 MED ORDER — METRONIDAZOLE 500 MG PO TABS: 500.0000 mg | ORAL_TABLET | Freq: Two times a day (BID) | ORAL | Status: AC

## 2015-01-31 ENCOUNTER — Encounter: Payer: Self-pay | Admitting: *Deleted

## 2015-02-05 ENCOUNTER — Telehealth: Payer: Self-pay | Admitting: *Deleted

## 2015-02-05 NOTE — Telephone Encounter (Signed)
Call placed to pt making her aware that her Rx for Solmon IceSeasonique has been approved by her insurance for calender year 2017.   Pt advised to call if any changes in her insurance plan for 2017 year. Pt advised to call if any problems in getting refills on Rx. Pt states understanding.

## 2015-10-10 ENCOUNTER — Telehealth: Payer: Self-pay | Admitting: *Deleted

## 2015-10-10 NOTE — Telephone Encounter (Signed)
Call returned message left.

## 2015-10-11 ENCOUNTER — Telehealth: Payer: Self-pay | Admitting: *Deleted

## 2015-10-11 NOTE — Telephone Encounter (Signed)
Problem discussed with patient see other call note.

## 2015-10-11 NOTE — Telephone Encounter (Signed)
Patient called on yesterday with c/o breakthrough bleeding on birth control pills.Patient has a hx of endometriosis and abnormal bleeding.Dr Clearance CootsHarper wants her to continue with Dr Marcia BrashJackson-Moore's prior regime to take an extra BCP x 1 week and let us know if this takes care of the bleeding.

## 2015-10-17 ENCOUNTER — Telehealth: Payer: Self-pay | Admitting: *Deleted

## 2015-10-17 NOTE — Telephone Encounter (Signed)
Made patient aware that we are keeping an eye out for her transportation voucher and if it does not arrive in a timely manner I will call her.

## 2015-12-21 ENCOUNTER — Encounter (HOSPITAL_BASED_OUTPATIENT_CLINIC_OR_DEPARTMENT_OTHER): Payer: Self-pay | Admitting: Emergency Medicine

## 2015-12-21 ENCOUNTER — Emergency Department (HOSPITAL_BASED_OUTPATIENT_CLINIC_OR_DEPARTMENT_OTHER): Payer: Medicare Other

## 2015-12-21 ENCOUNTER — Emergency Department (HOSPITAL_BASED_OUTPATIENT_CLINIC_OR_DEPARTMENT_OTHER)
Admission: EM | Admit: 2015-12-21 | Discharge: 2015-12-21 | Disposition: A | Discharge status: Home / Self Care | Payer: Medicare Other | Attending: Emergency Medicine | Admitting: Emergency Medicine

## 2015-12-21 DIAGNOSIS — Y999 Unspecified external cause status: Secondary | ICD-10-CM | POA: Insufficient documentation

## 2015-12-21 DIAGNOSIS — S46812A Strain of other muscles, fascia and tendons at shoulder and upper arm level, left arm, initial encounter: Secondary | ICD-10-CM | POA: Diagnosis not present

## 2015-12-21 DIAGNOSIS — I10 Essential (primary) hypertension: Secondary | ICD-10-CM | POA: Insufficient documentation

## 2015-12-21 DIAGNOSIS — S20212A Contusion of left front wall of thorax, initial encounter: Secondary | ICD-10-CM | POA: Insufficient documentation

## 2015-12-21 DIAGNOSIS — Y939 Activity, unspecified: Secondary | ICD-10-CM | POA: Insufficient documentation

## 2015-12-21 DIAGNOSIS — W050XXA Fall from non-moving wheelchair, initial encounter: Secondary | ICD-10-CM | POA: Diagnosis not present

## 2015-12-21 DIAGNOSIS — S299XXA Unspecified injury of thorax, initial encounter: Secondary | ICD-10-CM | POA: Diagnosis present

## 2015-12-21 DIAGNOSIS — Z79899 Other long term (current) drug therapy: Secondary | ICD-10-CM | POA: Diagnosis not present

## 2015-12-21 DIAGNOSIS — Y929 Unspecified place or not applicable: Secondary | ICD-10-CM | POA: Insufficient documentation

## 2015-12-21 MED ORDER — IBUPROFEN 400 MG PO TABS
600.0000 mg | ORAL_TABLET | Freq: Once | ORAL | Status: AC
  Administered 2015-12-21: 600 mg via ORAL
  Filled 2015-12-21: qty 1

## 2015-12-21 MED ORDER — IBUPROFEN 600 MG PO TABS: 600.0000 mg | ORAL_TABLET | Freq: Four times a day (QID) | ORAL | 0 refills | Status: AC | PRN

## 2015-12-21 MED ORDER — METHOCARBAMOL 500 MG PO TABS: 1000.0000 mg | ORAL_TABLET | Freq: Three times a day (TID) | ORAL | 0 refills | Status: AC | PRN

## 2015-12-21 MED ORDER — METHOCARBAMOL 500 MG PO TABS
1000.0000 mg | ORAL_TABLET | Freq: Once | ORAL | Status: AC
  Administered 2015-12-21: 1000 mg via ORAL
  Filled 2015-12-21: qty 2

## 2015-12-21 NOTE — ED Provider Notes (Signed)
[facility] Provider Note   CSN: [id number] Arrival date & time: 12/21/15  1900     History   Chief Complaint Chief Complaint  Patient presents with  . Fall    HPI Tiffany Pineda is a 35 y.o. female.  HPI  HPI Comments: Tiffany Pineda is a 35 y.o. female who presents to the Emergency Department complaining of neck pain, LLQ back pain and right knee pain.   Pt reports being strapped in her wheelchair when she leaned forward Falling to the ground with her wheelchair still strapped. Denies head injury. No loss of consciousness. She complains of left thoracic back pain and left knee pain. No new weakness or numbness. 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   Past Medical History:  Diagnosis Date  . Cerebral palsy (HCC)   . Endometriosis   . Fibromyalgia   . Hyperlipidemia   . Hypertension   . Seasonal allergies     Patient Active Problem List   Diagnosis Date Noted  . Chronic pelvic pain in female 06/25/2012    History reviewed. No pertinent surgical history.  OB History    No data available       Home Medications    Prior to Admission medications   Medication Sig Start Date End Date Taking? Authorizing Provider  celecoxib (CELEBREX) 200 MG capsule Take 200 mg by mouth 2 (two) times daily.    Historical Provider, MD  chlorpheniramine (CHLOR-TRIMETON) 4 MG tablet Take 4 mg by mouth 2 (two) times daily as needed for allergies.    Historical Provider, MD  fluconazole (DIFLUCAN) 100 MG tablet Take 1 tablet (100 mg total) by mouth once. Repeat dose in 48-72 hour. 12/27/14   Rachelle A Denney, CNM  fluticasone (FLONASE) 50 MCG/ACT nasal spray Place into both nostrils daily.    Historical Provider, MD  ibuprofen (ADVIL,MOTRIN) 600 MG tablet Take 1 tablet (600 mg total) by mouth every 6 (six) hours as needed. 12/21/15   Loren Racer, MD  Levonorgestrel-Ethinyl Estradiol (SEASONIQUE) 0.15-0.03 &0.01 MG tablet Take 1 tablet by mouth  daily. On a continuous basis 12/27/14   Rachelle A Denney, CNM  lisinopril (PRINIVIL,ZESTRIL) 10 MG tablet Take 10 mg by mouth daily.    Historical Provider, MD  lovastatin (ALTOPREV) 40 MG 24 hr tablet Take 40 mg by mouth at bedtime.    Historical Provider, MD  methocarbamol (ROBAXIN) 500 MG tablet Take 2 tablets (1,000 mg total) by mouth every 8 (eight) hours as needed for muscle spasms. 12/21/15   Loren Racer, MD  metroNIDAZOLE (FLAGYL) 500 MG tablet Take 1 tablet (500 mg total) by mouth 2 (two) times daily. 01/02/15   Rachelle A Denney, CNM  norethindrone-ethinyl estradiol (JUNEL FE,GILDESS FE,LOESTRIN FE) 1-20 MG-MCG tablet Take 1 tablet by mouth daily.    Historical Provider, MD    Family History History reviewed. No pertinent family history.  Social History Social History  Substance Use Topics  . Smoking status: Never Smoker  . Smokeless tobacco: Never Used  . Alcohol use No     Allergies   Review of patient's allergies indicates no known allergies.   Review of Systems Review of Systems  Constitutional: Negative for chills and fever.  Respiratory: Negative for shortness of breath.   Cardiovascular: Negative for chest pain.  Gastrointestinal: Negative for abdominal pain, nausea and vomiting.  Musculoskeletal: Positive for back pain and myalgias. Negative for arthralgias, joint swelling, neck pain and neck stiffness.  Skin: Negative for pallor and  wound.  Neurological: Negative for dizziness, syncope, weakness, light-headedness, numbness and headaches.  All other systems reviewed and are negative.    Physical Exam Updated Vital Signs BP 129/87   Pulse 92   Temp 98.2 F (36.8 C) (Oral)   Resp 20   SpO2 98%   Physical Exam  Constitutional: She is oriented to person, place, and time. She appears well-developed and well-nourished.  HENT:  Head: Normocephalic and atraumatic.  Mouth/Throat: Oropharynx is clear and moist.  Eyes: EOM are normal. Pupils are equal,  round, and reactive to light.  Neck: Normal range of motion. Neck supple.  No posterior midline cervical tenderness to palpation. Patient does have left trapezius spasm and tenderness. No obvious injury. Patient also has left posterior rib tenderness without obvious contusion, deformity or swelling. The patient has atrophy and contraction of bilateral lower extremities. Mild medial thigh tenderness just proximal to the left knee. There is no obvious injury. No tenderness, swelling or erythema to the left knee.  Cardiovascular: Normal rate and regular rhythm.   Pulmonary/Chest: Effort normal and breath sounds normal.  Abdominal: Soft. Bowel sounds are normal. There is no tenderness. There is no rebound and no guarding.  Musculoskeletal: Normal range of motion. She exhibits no edema or tenderness.  Neurological: She is alert and oriented to person, place, and time.  Moving all extremities though limited mobility in bilateral lower extremities. Sensation is grossly intact.  Skin: Skin is warm and dry. Capillary refill takes less than 2 seconds. No rash noted. No erythema.  Psychiatric: She has a normal mood and affect. Her behavior is normal.  Nursing note and vitals reviewed.    ED Treatments / Results   DIAGNOSTIC STUDIES:    COORDINATION OF CARE: 7:49 PM Discussed treatment plan with pt at bedside which includes XR and pt agreed to plan.   Labs (all labs ordered are listed, but only abnormal results are displayed) Labs Reviewed - No data to display  EKG  EKG Interpretation None       Radiology Dg Ribs Unilateral W/chest Left  Result Date: 12/21/2015 CLINICAL DATA:  Fall.  Left posterior rib pain.  Initial encounter. EXAM: LEFT RIBS AND CHEST - 3+ VIEW COMPARISON:  None. FINDINGS: No fracture involving the ribs. There is no evidence of pneumothorax or pleural effusion. Clear low lung volumes. Normal heart size. Mediastinal contours are distorted by rotation. IMPRESSION:  Negative. Electronically Signed   By: Marnee Spring M.D.   On: 12/21/2015 20:20    Procedures Procedures (including critical care time)  Medications Ordered in ED Medications  ibuprofen (ADVIL,MOTRIN) tablet 600 mg (600 mg Oral Given 12/21/15 2015)  methocarbamol (ROBAXIN) tablet 1,000 mg (1,000 mg Oral Given 12/21/15 2015)     Initial Impression / Assessment and Plan / ED Course  I have reviewed the triage vital signs and the nursing notes.  Pertinent labs & imaging results that were available during my care of the patient were reviewed by me and considered in my medical decision making (see chart for details).  Clinical Course   X-ray without any acute abnormality. We'll treat for rib contusion and muscles by some. Return precautions given.   Final Clinical Impressions(s) / ED Diagnoses   Final diagnoses:  Rib contusion, left, initial encounter  Trapezius strain, left, initial encounter   I personally performed the services described in this documentation, which was scribed in my presence. The recorded information has been reviewed and is accurate.      New Prescriptions  New Prescriptions   IBUPROFEN (ADVIL,MOTRIN) 600 MG TABLET    Take 1 tablet (600 mg total) by mouth every 6 (six) hours as needed.   METHOCARBAMOL (ROBAXIN) 500 MG TABLET    Take 2 tablets (1,000 mg total) by mouth every 8 (eight) hours as needed for muscle spasms.     Loren Raceravid Jaziya Obarr, MD 12/21/15 2036

## 2015-12-21 NOTE — ED Notes (Signed)
Patient is alert and oriented x3.  She was given DC instructions with follow up instructions. Patient DC via stretcher to home V/S stable.  She was not showing any signs of distress on DC

## 2015-12-21 NOTE — ED Notes (Signed)
[facility] notified of the patients need for transfer home

## 2015-12-21 NOTE — ED Notes (Signed)
Patient with concerns about her neck and neck tightness . The MD aware  - the patient given instructions about neck pain and what we will to assist with the patient's pain. The patient is agreeable to the plan

## 2015-12-21 NOTE — ED Triage Notes (Signed)
Patient was in wheelchair strapped in, the patient leaned forward out of the wheelchair and the wheelchair fell over onto her. She was under the wheelchair about 20 minutes. Patient reports pain to her left neck, flank and knee post fall. Patient per EMS is all with in her normal

## 2015-12-22 ENCOUNTER — Telehealth (HOSPITAL_BASED_OUTPATIENT_CLINIC_OR_DEPARTMENT_OTHER): Payer: Self-pay | Admitting: *Deleted

## 2015-12-22 NOTE — Telephone Encounter (Signed)
Pt called department, [phone][facility] Prescriptions for Motrin and Robaxin given and accepted by pharmacist Shanda Bumps(Jessica). Attempted to call pt to inform her that her prescriptions have been called into pharmacy -- no answer and no opportunity to leave voicemail.

## 2015-12-28 ENCOUNTER — Ambulatory Visit: Payer: Self-pay | Admitting: Certified Nurse Midwife

## 2019-10-20 ENCOUNTER — Other Ambulatory Visit: Payer: Self-pay | Admitting: Obstetrics & Gynecology

## 2019-10-20 DIAGNOSIS — Z79811 Long term (current) use of aromatase inhibitors: Secondary | ICD-10-CM

## 2019-10-20 DIAGNOSIS — Z1231 Encounter for screening mammogram for malignant neoplasm of breast: Secondary | ICD-10-CM

## 2020-01-31 ENCOUNTER — Ambulatory Visit
Admission: RE | Admit: 2020-01-31 | Discharge: 2020-01-31 | Disposition: A | Discharge status: Home / Self Care | Payer: Medicare Other | Source: Ambulatory Visit | Attending: Obstetrics & Gynecology | Admitting: Obstetrics & Gynecology

## 2020-01-31 ENCOUNTER — Other Ambulatory Visit: Payer: Self-pay

## 2020-01-31 DIAGNOSIS — Z79811 Long term (current) use of aromatase inhibitors: Secondary | ICD-10-CM

## 2020-01-31 DIAGNOSIS — Z1231 Encounter for screening mammogram for malignant neoplasm of breast: Secondary | ICD-10-CM

## 2020-11-01 ENCOUNTER — Other Ambulatory Visit: Payer: Self-pay | Admitting: Internal Medicine

## 2020-11-01 DIAGNOSIS — Z1231 Encounter for screening mammogram for malignant neoplasm of breast: Secondary | ICD-10-CM

## 2021-05-02 ENCOUNTER — Ambulatory Visit
Admission: RE | Admit: 2021-05-02 | Discharge: 2021-05-02 | Disposition: A | Discharge status: Home / Self Care | Payer: Medicare Other | Source: Ambulatory Visit | Attending: Internal Medicine | Admitting: Internal Medicine

## 2021-05-02 DIAGNOSIS — Z1231 Encounter for screening mammogram for malignant neoplasm of breast: Secondary | ICD-10-CM

## 2022-09-21 IMAGING — MG MM DIGITAL SCREENING BILAT W/ TOMO AND CAD
8 series · 8 of 24 positions shown · non-contrast
Comparison: Previous exam(s).

CLINICAL DATA: Screening.

EXAM:
DIGITAL SCREENING BILATERAL MAMMOGRAM WITH TOMOSYNTHESIS AND CAD
TECHNIQUE: Bilateral screening digital craniocaudal and mediolateral oblique
mammograms were obtained. Bilateral screening digital breast
tomosynthesis was performed. The images were evaluated with
computer-aided detection.

[L CC synth-2D]
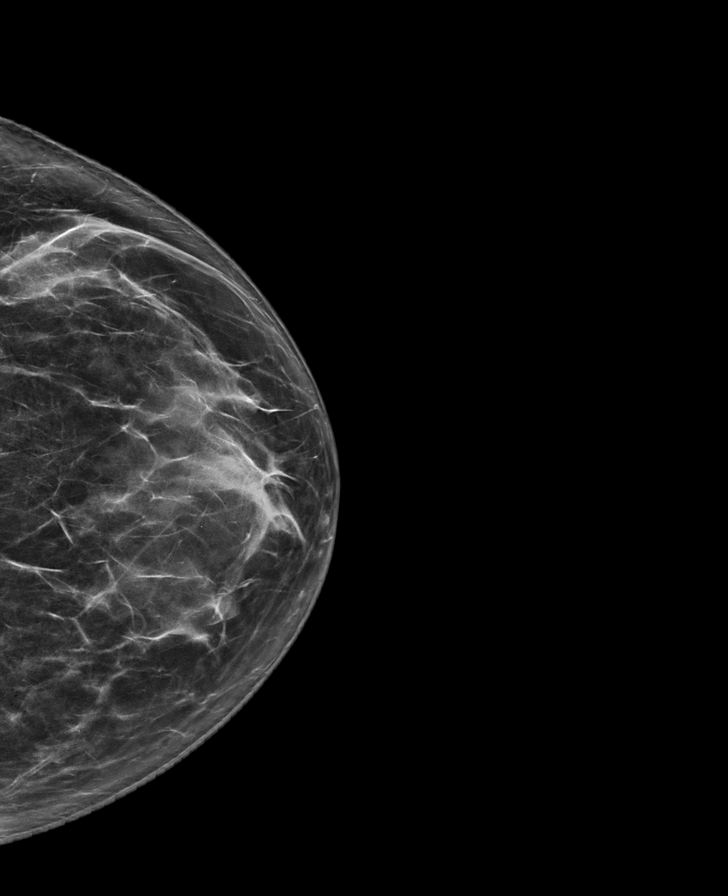

[R MLO synth-2D]
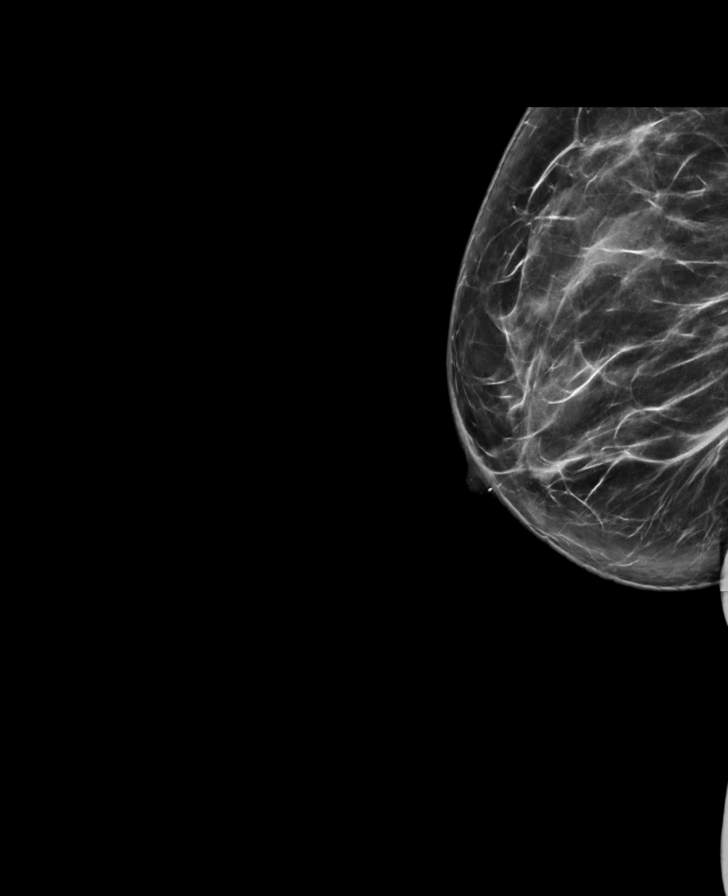

[L MLO synth-2D]
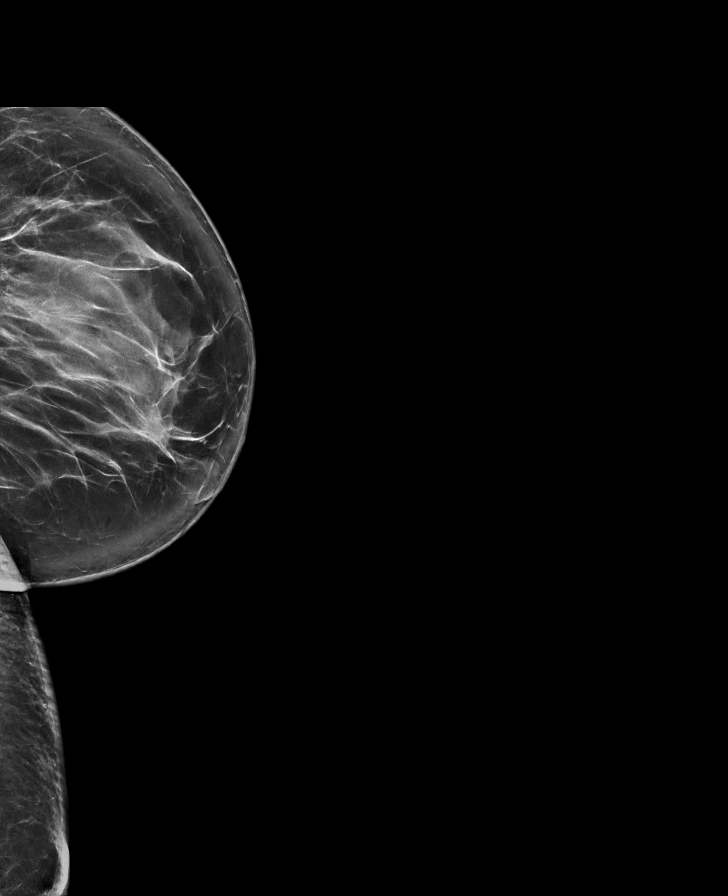

[R CC synth-2D]
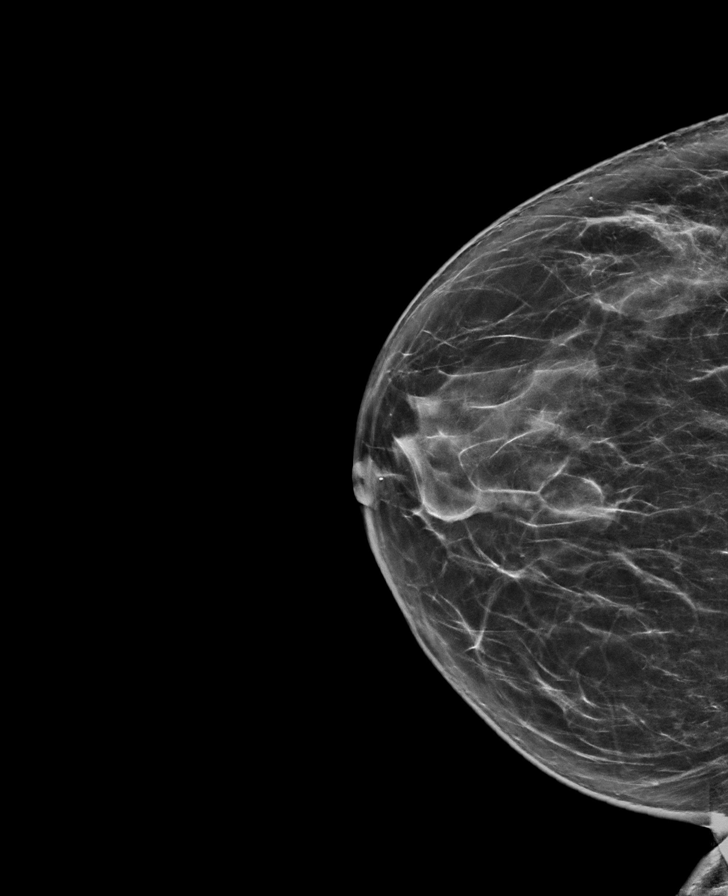

[L CC tomo · tomo slice 37/74.0]
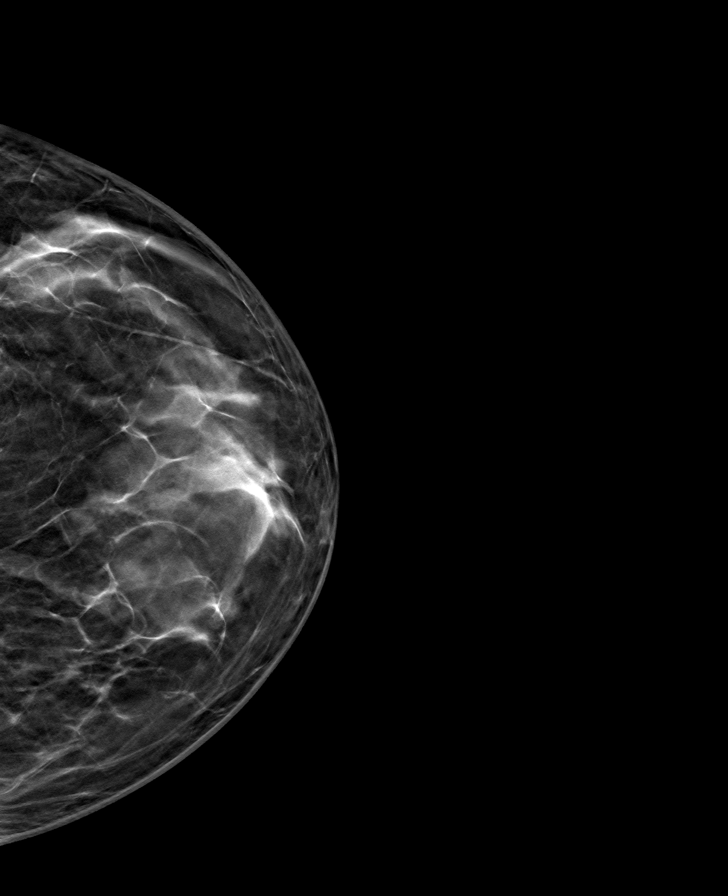

[R MLO tomo · tomo slice 38/75.0]
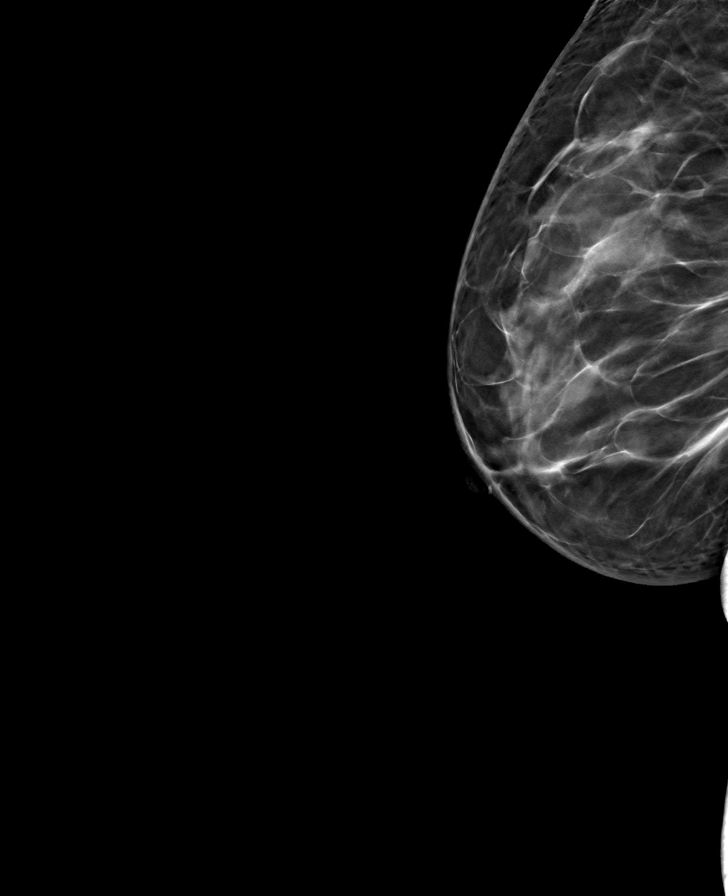

[L MLO tomo · tomo slice 46/91.0]
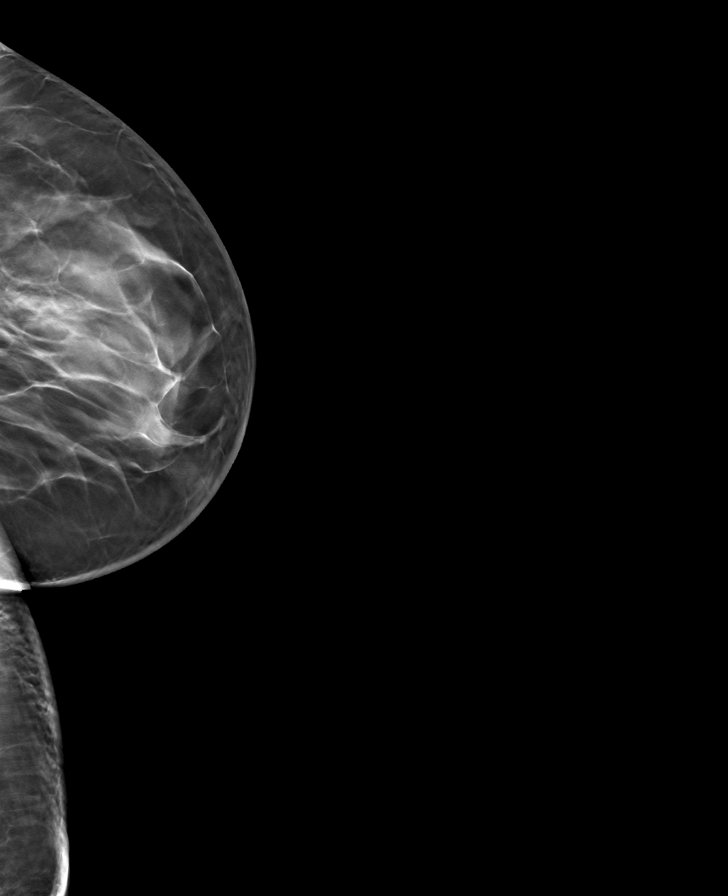

[R CC tomo · tomo slice 38/75.0]
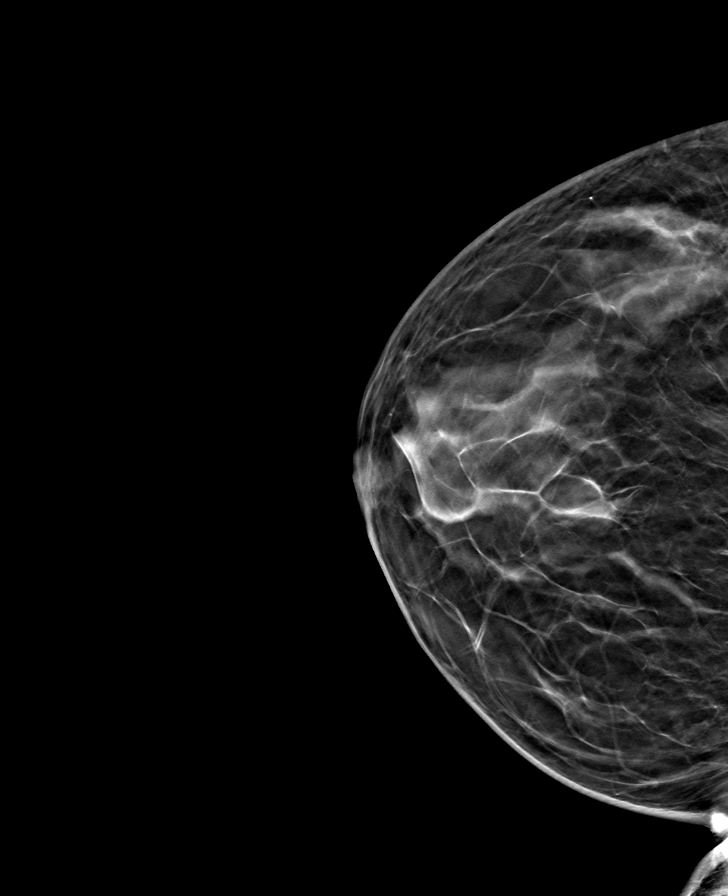

[8 of 24 positions shown; findings below may reference images not displayed]

ACR Breast Density Category c: The breast tissue is heterogeneously
dense, which may obscure small masses.
FINDINGS: There are no findings suspicious for malignancy.
IMPRESSION: No mammographic evidence of malignancy. A result letter of this
screening mammogram will be mailed directly to the patient.

RECOMMENDATION:
Screening mammogram in one year. (Code:[id number]-W-BC3)

BI-RADS CATEGORY  1: Negative.
